# Patient Record
Sex: Male | Born: 1990 | Race: White | Hispanic: No | Marital: Single | Smoking: Current every day smoker
Health system: Southern US, Community
[De-identification: ages and names within clinical notes are randomized; demographics above are authoritative.]

## PROBLEM LIST (undated history)

## (undated) HISTORY — PX: ORTHOPEDIC SURGERY: SHX850

## (undated) HISTORY — PX: ESOPHAGUS SURGERY: SHX626

---
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
## 2014-05-04 ENCOUNTER — Encounter (HOSPITAL_COMMUNITY): Payer: Self-pay | Admitting: Emergency Medicine

## 2014-05-04 ENCOUNTER — Emergency Department (HOSPITAL_COMMUNITY)
Admission: EM | Admit: 2014-05-04 | Discharge: 2014-05-04 | Disposition: A | Discharge status: Home / Self Care | Payer: Self-pay | Attending: Emergency Medicine | Admitting: Emergency Medicine

## 2014-05-04 DIAGNOSIS — F17200 Nicotine dependence, unspecified, uncomplicated: Secondary | ICD-10-CM | POA: Insufficient documentation

## 2014-05-04 DIAGNOSIS — M62830 Muscle spasm of back: Secondary | ICD-10-CM

## 2014-05-04 DIAGNOSIS — S46812A Strain of other muscles, fascia and tendons at shoulder and upper arm level, left arm, initial encounter: Secondary | ICD-10-CM

## 2014-05-04 DIAGNOSIS — Y9389 Activity, other specified: Secondary | ICD-10-CM | POA: Insufficient documentation

## 2014-05-04 DIAGNOSIS — S43499A Other sprain of unspecified shoulder joint, initial encounter: Secondary | ICD-10-CM | POA: Insufficient documentation

## 2014-05-04 DIAGNOSIS — Y92410 Unspecified street and highway as the place of occurrence of the external cause: Secondary | ICD-10-CM | POA: Insufficient documentation

## 2014-05-04 DIAGNOSIS — S0990XA Unspecified injury of head, initial encounter: Secondary | ICD-10-CM | POA: Insufficient documentation

## 2014-05-04 DIAGNOSIS — S46819A Strain of other muscles, fascia and tendons at shoulder and upper arm level, unspecified arm, initial encounter: Secondary | ICD-10-CM

## 2014-05-04 DIAGNOSIS — IMO0002 Reserved for concepts with insufficient information to code with codable children: Secondary | ICD-10-CM | POA: Insufficient documentation

## 2014-05-04 MED ORDER — CYCLOBENZAPRINE HCL 10 MG PO TABS: 10.0000 mg | ORAL_TABLET | Freq: Three times a day (TID) | ORAL | Status: AC | PRN

## 2014-05-04 MED ORDER — IBUPROFEN 800 MG PO TABS
800.0000 mg | ORAL_TABLET | Freq: Once | ORAL | Status: AC
  Administered 2014-05-04: 800 mg via ORAL
  Filled 2014-05-04: qty 1

## 2014-05-04 MED ORDER — IBUPROFEN 800 MG PO TABS: 800.0000 mg | ORAL_TABLET | Freq: Three times a day (TID) | ORAL | Status: DC

## 2014-05-04 MED ORDER — CYCLOBENZAPRINE HCL 10 MG PO TABS
5.0000 mg | ORAL_TABLET | Freq: Once | ORAL | Status: AC
  Administered 2014-05-04: 5 mg via ORAL
  Filled 2014-05-04: qty 1

## 2014-05-04 NOTE — Discharge Instructions (Signed)
Please use rest, ice, compression and no lesions reduced pain and swelling. Follow up with a primary care provider or orthopedic specialist for continued evaluation and treatment.    Back Exercises Back exercises help treat and prevent back injuries. The goal is to increase your strength in your belly (abdominal) and back muscles. These exercises can also help with flexibility. Start these exercises when told by your doctor. HOME CARE Back exercises include: Pelvic Tilt.  Lie on your back with your knees bent. Tilt your pelvis until the lower part of your back is against the floor. Hold this position 5 to 10 sec. Repeat this exercise 5 to 10 times. Knee to Chest.  Pull 1 knee up against your chest and hold for 20 to 30 seconds. Repeat this with the other knee. This may be done with the other leg straight or bent, whichever feels better. Then, pull both knees up against your chest. Sit-Ups or Curl-Ups.  Bend your knees 90 degrees. Start with tilting your pelvis, and do a partial, slow sit-up. Only lift your upper half 30 to 45 degrees off the floor. Take at least 2 to 3 seonds for each sit-up. Do not do sit-ups with your knees out straight. If partial sit-ups are difficult, simply do the above but with only tightening your belly (abdominal) muscles and holding it as told. Hip-Lift.  Lie on your back with your knees flexed 90 degrees. Push down with your feet and shoulders as you raise your hips 2 inches off the floor. Hold for 10 seconds, repeat 5 to 10 times. Back Arches.  Lie on your stomach. Prop yourself up on bent elbows. Slowly press on your hands, causing an arch in your low back. Repeat 3 to 5 times. Shoulder-Lifts.  Lie face down with arms beside your body. Keep hips and belly pressed to floor as you slowly lift your head and shoulders off the floor. Do not overdo your exercises. Be careful in the beginning. Exercises may cause you some mild back discomfort. If the pain lasts for  more than 15 minutes, stop the exercises until you see your doctor. Improvement with exercise for back problems is slow.  Document Released: 09/20/2010 Document Revised: 11/10/2011 Document Reviewed: 06/19/2011 [facility] Patient Information 2015 [place], [place]. This information is not intended to replace advice given to you by your health care provider. Make sure you discuss any questions you have with your health care provider.    Heat Therapy Heat therapy can help make painful, stiff muscles and joints feel better. Do not use heat on new injuries. Wait at least 48 hours after an injury to use heat. Do not use heat when you have aches or pains right after an activity. If you still have pain 3 hours after stopping the activity, then you may use heat. HOME CARE Wet heat pack  Soak a clean towel in warm water. Squeeze out the extra water.  Put the warm, wet towel in a plastic bag.  Place a thin, dry towel between your skin and the bag.  Put the heat pack on the area for 5 minutes, and check your skin. Your skin may be pink, but it should not be red.  Leave the heat pack on the area for 15 to 30 minutes.  Repeat this every 2 to 4 hours while awake. Do not use heat while you are sleeping. Warm water bath  Fill a tub with warm water.  Place the affected body part in the tub.  Soak  the area for 20 to 40 minutes.  Repeat as needed. Hot water bottle  Fill the water bottle half full with hot water.  Press out the extra air. Close the cap tightly.  Place a dry towel between your skin and the bottle.  Put the bottle on the area for 5 minutes, and check your skin. Your skin may be pink, but it should not be red.  Leave the bottle on the area for 15 to 30 minutes.  Repeat this every 2 to 4 hours while awake. Electric heating pad  Place a dry towel between your skin and the heating pad.  Set the heating pad on low heat.  Put the heating pad on the area for 10 minutes, and check  your skin. Your skin may be pink, but it should not be red.  Leave the heating pad on the area for 20 to 40 minutes.  Repeat this every 2 to 4 hours while awake.  Do not lie on the heating pad.  Do not fall asleep while using the heating pad.  Do not use the heating pad near water. GET HELP RIGHT AWAY IF:  You get blisters or red skin.  Your skin is puffy (swollen), or you lose feeling (numbness) in the affected area.  You have any new problems.  Your problems are getting worse.  You have any questions or concerns. If you have any problems, stop using heat therapy until you see your doctor. MAKE SURE YOU:  Understand these instructions.  Will watch your condition.  Will get help right away if you are not doing well or get worse. Document Released: 11/10/2011 Document Reviewed: 10/11/2013 [facility] Patient Information 2015 [place], [place]. This information is not intended to replace advice given to you by your health care provider. Make sure you discuss any questions you have with your health care provider.    Muscle Cramps and Spasms Muscle cramps and spasms are when muscles tighten by themselves. They usually get better within minutes. Muscle cramps are painful. They are usually stronger and last longer than muscle spasms. Muscle spasms may or may not be painful. They can last a few seconds or much longer. HOME CARE  Drink enough fluid to keep your pee (urine) clear or pale yellow.  Massage, stretch, and relax the muscle.  Use a warm towel, heating pad, or warm shower water on tight muscles.  Place ice on the muscle if it is tender or in pain.  Put ice in a plastic bag.  Place a towel between your skin and the bag.  Leave the ice on for 15-20 minutes, 03-04 times a day.  Only take medicine as told by your doctor. GET HELP RIGHT AWAY IF:  Your cramps or spasms get worse, happen more often, or do not get better with time. MAKE SURE YOU:  Understand these  instructions.  Will watch your condition.  Will get help right away if you are not doing well or get worse. Document Released: 07/31/2008 Document Revised: 12/13/2012 Document Reviewed: 08/04/2012 [facility] Patient Information 2015 [employer], [place]. This information is not intended to replace advice given to you by your health care provider. Make sure you discuss any questions you have with your health care provider.    Muscle Strain A muscle strain (pulled muscle) happens when a muscle is stretched beyond normal length. It happens when a sudden, violent force stretches your muscle too far. Usually, a few of the fibers in your muscle are torn. Muscle strain is  common in athletes. Recovery usually takes 1-2 weeks. Complete healing takes 5-6 weeks.  HOME CARE   Follow the PRICE method of treatment to help your injury get better. Do this the first 2-3 days after the injury:  Protect. Protect the muscle to keep it from getting injured again.  Rest. Limit your activity and rest the injured body part.  Ice. Put ice in a plastic bag. Place a towel between your skin and the bag. Then, apply the ice and leave it on from 15-20 minutes each hour. After the third day, switch to moist heat packs.  Compression. Use a splint or elastic bandage on the injured area for comfort. Do not put it on too tightly.  Elevate. Keep the injured body part above the level of your heart.  Only take medicine as told by your doctor.  Warm up before doing exercise to prevent future muscle strains. GET HELP IF:   You have more pain or puffiness (swelling) in the injured area.  You feel numbness, tingling, or notice a loss of strength in the injured area. MAKE SURE YOU:   Understand these instructions.  Will watch your condition.  Will get help right away if you are not doing well or get worse. Document Released: 05/27/2008 Document Revised: 06/08/2013 Document Reviewed: 03/17/2013 [facility] Patient Information  2015 [place], [place]. This information is not intended to replace advice given to you by your health care provider. Make sure you discuss any questions you have with your health care provider.
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
## 2014-05-07 NOTE — ED Provider Notes (Signed)
Medical screening examination/treatment/procedure(s) were performed by non-physician practitioner and as supervising physician I was immediately available for consultation/collaboration.  Toy Baker, MD 05/07/14 [phone]

## 2014-08-20 ENCOUNTER — Encounter (HOSPITAL_COMMUNITY): Payer: Self-pay | Admitting: *Deleted

## 2014-08-20 ENCOUNTER — Emergency Department (HOSPITAL_COMMUNITY)
Admission: EM | Admit: 2014-08-20 | Discharge: 2014-08-20 | Disposition: A | Discharge status: Home / Self Care | Payer: No Typology Code available for payment source | Attending: Emergency Medicine | Admitting: Emergency Medicine

## 2014-08-20 DIAGNOSIS — J020 Streptococcal pharyngitis: Secondary | ICD-10-CM | POA: Insufficient documentation

## 2014-08-20 DIAGNOSIS — R110 Nausea: Secondary | ICD-10-CM | POA: Insufficient documentation

## 2014-08-20 DIAGNOSIS — Z720 Tobacco use: Secondary | ICD-10-CM | POA: Insufficient documentation

## 2014-08-20 LAB — RAPID STREP SCREEN (MED CTR MEBANE ONLY): Streptococcus, Group A Screen (Direct): POSITIVE — AB

## 2014-08-20 MED ORDER — PENICILLIN G BENZATHINE 1200000 UNIT/2ML IM SUSP
1.2000 10*6.[IU] | Freq: Once | INTRAMUSCULAR | Status: AC
  Administered 2014-08-20: 1.2 10*6.[IU] via INTRAMUSCULAR
  Filled 2014-08-20: qty 2

## 2014-08-20 MED ORDER — HYDROCODONE-ACETAMINOPHEN 7.5-325 MG/15ML PO SOLN: 15.0000 mL | Freq: Four times a day (QID) | ORAL | Status: AC | PRN

## 2014-08-20 MED ORDER — HYDROCODONE-ACETAMINOPHEN 7.5-325 MG/15ML PO SOLN
10.0000 mL | Freq: Once | ORAL | Status: AC
  Administered 2014-08-20: 10 mL via ORAL
  Filled 2014-08-20: qty 15

## 2014-08-20 NOTE — ED Provider Notes (Signed)
CSN: [id number]     Arrival date & time 08/20/14  0044 History   First MD Initiated Contact with Patient 08/20/14 0048     Chief Complaint  Patient presents with  . Sore Throat     (Consider location/radiation/quality/duration/timing/severity/associated sxs/prior Treatment) Patient is a 23 y.o. male presenting with pharyngitis. The history is provided by the patient. No language interpreter was used.  Sore Throat Episode onset: 2 weeks ago. The problem occurs constantly. The problem has been gradually worsening (worse over the past few days). Associated symptoms include chills, a fever (subjective), nausea and a sore throat. Pertinent negatives include no congestion, coughing, rash or vomiting. Associated symptoms comments: Negative for drooling or SOB. The symptoms are aggravated by swallowing and smoking. He has tried nothing for the symptoms. Improvement on treatment: No treatments tried PTA.    History reviewed. No pertinent past medical history. Past Surgical History  Procedure Laterality Date  . Esophagus surgery    . Orthopedic surgery     History reviewed. No pertinent family history. History  Substance Use Topics  . Smoking status: Current Every Day Smoker  . Smokeless tobacco: Not on file  . Alcohol Use: Yes    Review of Systems  Constitutional: Positive for fever (subjective) and chills.  HENT: Positive for sore throat. Negative for congestion, drooling and trouble swallowing.   Respiratory: Negative for cough.   Gastrointestinal: Positive for nausea. Negative for vomiting.  Skin: Negative for rash.  Hematological: Positive for adenopathy.  All other systems reviewed and are negative.   Allergies  Review of patient's allergies indicates no known allergies.  Home Medications   Prior to Admission medications   Medication Sig Start Date End Date Taking? Authorizing Provider  cyclobenzaprine (FLEXERIL) 10 MG tablet Take 1 tablet (10 mg total) by mouth 3 (three)  times daily as needed for muscle spasms. Patient not taking: Reported on 08/20/2014 05/04/14   Phill MutterPeter S Dammen, PA-C  HYDROcodone-acetaminophen (HYCET) 7.5-325 mg/15 ml solution Take 15 mLs by mouth every 6 (six) hours as needed for moderate pain or severe pain. 08/20/14   Antony MaduraKelly Mansi Tokar, PA-C  ibuprofen (ADVIL,MOTRIN) 800 MG tablet Take 1 tablet (800 mg total) by mouth 3 (three) times daily. Patient not taking: Reported on 08/20/2014 05/04/14   Phill MutterPeter S Dammen, PA-C   BP 125/58 mmHg  Pulse 87  Temp(Src) 98.4 F (36.9 C) (Oral)  Resp 20  SpO2 100%   Physical Exam  Constitutional: He is oriented to person, place, and time. He appears well-developed and well-nourished. No distress.  HENT:  Head: Normocephalic and atraumatic.  Mouth/Throat: Uvula is midline and mucous membranes are normal. No trismus in the jaw. Oropharyngeal exudate and posterior oropharyngeal erythema present.  B/l tonsils erythematous and enlarged with exudates. Uvula midline. Patient tolerating secretions without difficulty.  Eyes: Conjunctivae and EOM are normal. No scleral icterus.  Neck: Normal range of motion.  No nuchal rigidity or meningismus  Cardiovascular: Normal rate, regular rhythm and intact distal pulses.   Pulmonary/Chest: Effort normal. No respiratory distress.  Respirations even and unlabored.  Musculoskeletal: Normal range of motion.  Lymphadenopathy:    He has cervical adenopathy (tender anterior cervical lymphadenopathy).  Neurological: He is alert and oriented to person, place, and time. He exhibits normal muscle tone. Coordination normal.  GCS 15. Patient speaking in full sentences.  Skin: Skin is warm and dry. No rash noted. He is not diaphoretic. No erythema. No pallor.  Psychiatric: He has a normal mood and affect. His behavior  is normal.  Nursing note and vitals reviewed.   ED Course  Procedures (including critical care time) Labs Review Labs Reviewed  RAPID STREP SCREEN - Abnormal; Notable for  the following:    Streptococcus, Group A Screen (Direct) POSITIVE (*)    All other components within normal limits    Imaging Review No results found.   EKG Interpretation None      MDM   Final diagnoses:  Strep throat    Pt febrile with tonsillar exudate, cervical lymphadenopathy, and dysphagia; diagnosis of strep. Treated in the ED with Hycet and PCN IM. Discussed importance of water rehydration. Presentation not concerning for PTA or infxn spread to soft tissue. No trismus or uvula deviation. Specific return precautions discussed. Pt able to drink water in ED without difficulty with intact airway. Recommended PCP follow up. Return precautions provided and patient agreeable to plan with no unaddressed concerns.   Filed Vitals:   08/20/14 0059 08/20/14 0143  BP: 128/68 125/58  Pulse: 96 87  Temp: 98.5 F (36.9 C) 98.4 F (36.9 C)  TempSrc: Oral Oral  Resp: 18 20  SpO2: 98% 100%        Antony MaduraKelly Hershal Eriksson, PA-C 08/20/14 0149  Derwood KaplanAnkit Nanavati, MD 08/20/14 [phone]

## 2014-08-20 NOTE — Discharge Instructions (Signed)
You may take Hycet as prescribed for sore throat. You have been treated in the ED with a penicillin shot for your strep throat. Follow-up with her primary care doctor to ensure resolution of symptoms. Be sure to drink plenty of fluids to prevent dehydration. Return to the emergency department if you develop any of the symptoms listed below such as difficulty breathing, drooling, or inability to swallow.  Strep Throat Strep throat is an infection of the throat caused by a bacteria named Streptococcus pyogenes. Your health care provider may call the infection streptococcal "tonsillitis" or "pharyngitis" depending on whether there are signs of inflammation in the tonsils or back of the throat. Strep throat is most common in children aged 5-15 years during the cold months of the year, but it can occur in people of any age during any season. This infection is spread from person to person (contagious) through coughing, sneezing, or other close contact. SIGNS AND SYMPTOMS   Fever or chills.  Painful, swollen, red tonsils or throat.  Pain or difficulty when swallowing.  White or yellow spots on the tonsils or throat.  Swollen, tender lymph nodes or "glands" of the neck or under the jaw.  Red rash all over the body (rare). DIAGNOSIS  Many different infections can cause the same symptoms. A test must be done to confirm the diagnosis so the right treatment can be given. A "rapid strep test" can help your health care provider make the diagnosis in a few minutes. If this test is not available, a light swab of the infected area can be used for a throat culture test. If a throat culture test is done, results are usually available in a day or two. TREATMENT  Strep throat is treated with antibiotic medicine. HOME CARE INSTRUCTIONS   Gargle with 1 tsp of salt in 1 cup of warm water, 3-4 times per day or as needed for comfort.  Family members who also have a sore throat or fever should be tested for strep  throat and treated with antibiotics if they have the strep infection.  Make sure everyone in your household washes their hands well.  Do not share food, drinking cups, or personal items that could cause the infection to spread to others.  You may need to eat a soft food diet until your sore throat gets better.  Drink enough water and fluids to keep your urine clear or pale yellow. This will help prevent dehydration.  Get plenty of rest.  Stay home from school, day care, or work until you have been on antibiotics for 24 hours.  Take medicines only as directed by your health care provider.  Take your antibiotic medicine as directed by your health care provider. Finish it even if you start to feel better. SEEK MEDICAL CARE IF:   The glands in your neck continue to enlarge.  You develop a rash, cough, or earache.  You cough up green, yellow-brown, or bloody sputum.  You have pain or discomfort not controlled by medicines.  Your problems seem to be getting worse rather than better.  You have a fever. SEEK IMMEDIATE MEDICAL CARE IF:   You develop any new symptoms such as vomiting, severe headache, stiff or painful neck, chest pain, shortness of breath, or trouble swallowing.  You develop severe throat pain, drooling, or changes in your voice.  You develop swelling of the neck, or the skin on the neck becomes red and tender.  You develop signs of dehydration, such as fatigue, dry  mouth, and decreased urination.  You become increasingly sleepy, or you cannot wake up completely. MAKE SURE YOU:  Understand these instructions.  Will watch your condition.  Will get help right away if you are not doing well or get worse. Document Released: 08/15/2000 Document Revised: 01/02/2014 Document Reviewed: 10/17/2010 [facility] Patient Information 2015 [place]. This information is not intended to replace advice given to you by your health care provider. Make sure you discuss any  questions you have with your health care provider.

## 2014-08-20 NOTE — ED Notes (Signed)
Pt reports sorethroat x2 weeks, progressively worse - denies fever however admits to episodes of sweating last night.

## 2014-12-15 ENCOUNTER — Emergency Department (HOSPITAL_COMMUNITY): Payer: Self-pay

## 2014-12-15 ENCOUNTER — Encounter (HOSPITAL_COMMUNITY): Payer: Self-pay | Admitting: *Deleted

## 2014-12-15 ENCOUNTER — Emergency Department (HOSPITAL_COMMUNITY)
Admission: EM | Admit: 2014-12-15 | Discharge: 2014-12-15 | Disposition: A | Discharge status: Home / Self Care | Payer: Self-pay | Attending: Emergency Medicine | Admitting: Emergency Medicine

## 2014-12-15 DIAGNOSIS — S6991XA Unspecified injury of right wrist, hand and finger(s), initial encounter: Secondary | ICD-10-CM | POA: Insufficient documentation

## 2014-12-15 DIAGNOSIS — W228XXA Striking against or struck by other objects, initial encounter: Secondary | ICD-10-CM | POA: Insufficient documentation

## 2014-12-15 DIAGNOSIS — Z720 Tobacco use: Secondary | ICD-10-CM | POA: Insufficient documentation

## 2014-12-15 DIAGNOSIS — Y9289 Other specified places as the place of occurrence of the external cause: Secondary | ICD-10-CM | POA: Insufficient documentation

## 2014-12-15 DIAGNOSIS — Z791 Long term (current) use of non-steroidal anti-inflammatories (NSAID): Secondary | ICD-10-CM | POA: Insufficient documentation

## 2014-12-15 DIAGNOSIS — Y998 Other external cause status: Secondary | ICD-10-CM | POA: Insufficient documentation

## 2014-12-15 DIAGNOSIS — Y9389 Activity, other specified: Secondary | ICD-10-CM | POA: Insufficient documentation

## 2014-12-15 MED ORDER — IBUPROFEN 800 MG PO TABS: 800.0000 mg | ORAL_TABLET | Freq: Three times a day (TID) | ORAL | Status: DC
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
## 2015-10-14 ENCOUNTER — Emergency Department (HOSPITAL_COMMUNITY): Payer: BLUE CROSS/BLUE SHIELD

## 2015-10-14 ENCOUNTER — Emergency Department (HOSPITAL_COMMUNITY)
Admission: EM | Admit: 2015-10-14 | Discharge: 2015-10-14 | Disposition: A | Discharge status: Home / Self Care | Payer: BLUE CROSS/BLUE SHIELD | Attending: Emergency Medicine | Admitting: Emergency Medicine

## 2015-10-14 ENCOUNTER — Encounter (HOSPITAL_COMMUNITY): Payer: Self-pay | Admitting: Emergency Medicine

## 2015-10-14 DIAGNOSIS — F17200 Nicotine dependence, unspecified, uncomplicated: Secondary | ICD-10-CM | POA: Insufficient documentation

## 2015-10-14 DIAGNOSIS — Z791 Long term (current) use of non-steroidal anti-inflammatories (NSAID): Secondary | ICD-10-CM | POA: Insufficient documentation

## 2015-10-14 DIAGNOSIS — M542 Cervicalgia: Secondary | ICD-10-CM | POA: Diagnosis present

## 2015-10-14 DIAGNOSIS — M62838 Other muscle spasm: Secondary | ICD-10-CM | POA: Diagnosis not present

## 2015-10-14 DIAGNOSIS — R0602 Shortness of breath: Secondary | ICD-10-CM | POA: Diagnosis not present

## 2015-10-14 MED ORDER — IBUPROFEN 800 MG PO TABS: 800.0000 mg | ORAL_TABLET | Freq: Three times a day (TID) | ORAL | Status: AC

## 2015-10-14 MED ORDER — IBUPROFEN 400 MG PO TABS
800.0000 mg | ORAL_TABLET | Freq: Once | ORAL | Status: AC
  Administered 2015-10-14: 800 mg via ORAL
  Filled 2015-10-14: qty 2

## 2015-10-14 MED ORDER — METHOCARBAMOL 500 MG PO TABS: 500.0000 mg | ORAL_TABLET | Freq: Two times a day (BID) | ORAL | Status: AC

## 2015-10-14 MED ORDER — METHOCARBAMOL 500 MG PO TABS
1000.0000 mg | ORAL_TABLET | Freq: Once | ORAL | Status: AC
  Administered 2015-10-14: 1000 mg via ORAL
  Filled 2015-10-14: qty 2

## 2015-10-14 NOTE — ED Notes (Signed)
Pt states he woke up and his L side of his neck started hurting, hurts to move his arms up and it pulls on his neck. Pain is 10/10. Denies injury

## 2015-10-14 NOTE — ED Notes (Signed)
Requesting more pain medication.  PA at the bedside offered tylenol.  Refused.  Reports being unhappy with care.  Offered to let them speak with charge nurse.  Refused.  States we just want to leave.

## 2015-10-14 NOTE — ED Provider Notes (Signed)
CSN: [id number]     Arrival date & time 10/14/15  1953 History   By signing my name below, I, Evon Slack, attest that this documentation has been prepared under the direction and in the presence of Melton Krebs, PA-C. Electronically Signed: Evon Slack, ED Scribe. 10/14/2015. 8:30 PM.     Chief Complaint  Patient presents with  . Neck Pain   Patient is a 25 y.o. male presenting with neck pain. The history is provided by the patient. No language interpreter was used.  Neck Pain Associated symptoms: no fever    HPI Comments: Chad Yang is a 25 y.o. male who presents to the Emergency Department complaining of left sided neck pain onset today this morning when he woke up. He states that the pain is worse when moving his left arm and moving his neck. He states that when moving his arm it feels as if his neck is "pulling." He reports intermittent SOB due to the pain. He doesn't report any medications PTA. Pt denies trauma or injury to the neck. Denies fever or chills.   History reviewed. No pertinent past medical history. Past Surgical History  Procedure Laterality Date  . Esophagus surgery    . Orthopedic surgery     No family history on file. Social History  Substance Use Topics  . Smoking status: Current Every Day Smoker  . Smokeless tobacco: None  . Alcohol Use: Yes    Review of Systems  Constitutional: Negative for fever and chills.  Musculoskeletal: Positive for myalgias and neck pain.   10 Systems reviewed and all are negative for acute change except as noted in the HPI.   Allergies  Review of patient's allergies indicates no known allergies.  Home Medications   Prior to Admission medications   Medication Sig Start Date End Date Taking? Authorizing Provider  cyclobenzaprine (FLEXERIL) 10 MG tablet Take 1 tablet (10 mg total) by mouth 3 (three) times daily as needed for muscle spasms. Patient not taking: Reported on 08/20/2014 05/04/14   Ivonne Andrew,  PA-C  HYDROcodone-acetaminophen (HYCET) 7.5-325 mg/15 ml solution Take 15 mLs by mouth every 6 (six) hours as needed for moderate pain or severe pain. 08/20/14   Antony Madura, PA-C  ibuprofen (ADVIL,MOTRIN) 800 MG tablet Take 1 tablet (800 mg total) by mouth 3 (three) times daily. 12/15/14   Heather Laisure, PA-C   BP 143/84 mmHg  Pulse 87  Temp(Src) 98.2 F (36.8 C) (Oral)  Resp 18  SpO2 100%   Physical Exam  Constitutional: He is oriented to person, place, and time. He appears well-developed and well-nourished. No distress.  HENT:  Head: Normocephalic and atraumatic.  Eyes: Conjunctivae and EOM are normal.  Neck: Neck supple. No tracheal deviation present.  No meningeal signs  Cardiovascular: Normal rate.   Pulmonary/Chest: Effort normal. No respiratory distress.    Musculoskeletal: Normal range of motion. He exhibits tenderness. He exhibits no edema.  Left trapezius tenderness. Normal range of motion at left shoulder and c-spine. No midline tenderness.   Neurological: He is alert and oriented to person, place, and time.  Skin: Skin is warm and dry.  Psychiatric: He has a normal mood and affect. His behavior is normal.  Nursing note and vitals reviewed.   ED Course  Procedures  DIAGNOSTIC STUDIES: Oxygen Saturation is 100% on RA, normal by my interpretation.    COORDINATION OF CARE: 8:29 PM-Discussed treatment plan with pt at bedside and pt agreed to plan.   Labs Review Labs Reviewed -  No data to display  Imaging Review Dg Shoulder Left  10/14/2015  CLINICAL DATA:  Left posterior shoulder pain radiating to the neck, worse today. EXAM: LEFT SHOULDER - 2+ VIEW COMPARISON:  None. FINDINGS: There is no evidence of fracture or dislocation. There is no evidence of arthropathy or other focal bone abnormality. Soft tissues are unremarkable. IMPRESSION: Negative. Electronically Signed   By: Burman Nieves M.D.   On: 10/14/2015 21:49   MDM   Final diagnoses:  Trapezius muscle  spasm   Discussed plan with patient. Explained to him I feel this is more trapezius muscle pain and that x-ray is not indicated at this time. His girlfriend, present at bedside, is concerned because a meningitis patient has been in their apartment complex, and she is concerned about his shortness of breath. PE, ACS is less likely. Patient is afebrile and without meningeal signs. I explained I do not feel further workup is indicated at this time for shortness of breath or meningitis, as I feel this is musculoskeletal in nature. Patient stated, "I'm not trying to tell you you're wrong but that is not what this is. There's something wrong with my shoulder." I touched his shoulder and said, "This is your shoulder and you're not having tenderness here but you are having tenderness over your trapezius muscle." As I touched his trapezius muscle, he jumped in pain. Patient stated "you're getting smart with me and condescending." I apologized that I was coming off this way to them, and asked if it would make the patient feel like he had better care if I x-rayed his shoulder. Will x-ray his shoulder, provide muscle relaxer and high-dose ibuprofen.  Xray negative. Discussed results with patient. He states his pain has not changed despite muscle relaxer and ibuprofen. They are requesting a shot, and RN asked about toradol shot, but he was recently given an NSAID. Offered Tylenol but patient refused.  He is extremely upset about his care tonight. He states I was rude and that I did nothing for him. I asked the RN to contact the charge RN to discuss the patient's care with patient.  Patient refused conversation with charge RN.  Patient ambulated from department without difficulty.   I personally performed the services described in this documentation, which was scribed in my presence. The recorded information has been reviewed and is accurate.  Melton Krebs, PA-C 10/17/15 1610  Gerhard Munch, MD 10/17/15  [phone]

## 2015-10-14 NOTE — Discharge Instructions (Signed)
Chad Yang,  Please follow-up with orthopedics. Return to the emergency department if you develop shortness of breath during exertion, chest pain, increased pain. Feel better soon!   Heat Therapy Heat therapy can help ease sore, stiff, injured, and tight muscles and joints. Heat relaxes your muscles, which may help ease your pain.  RISKS AND COMPLICATIONS If you have any of the following conditions, do not use heat therapy unless your health care provider has approved:  Poor circulation.  Healing wounds or scarred skin in the area being treated.  Diabetes, heart disease, or high blood pressure.  Not being able to feel (numbness) the area being treated.  Unusual swelling of the area being treated.  Active infections.  Blood clots.  Cancer.  Inability to communicate pain. This may include young children and people who have problems with their brain function (dementia).  Pregnancy. Heat therapy should only be used on old, pre-existing, or long-lasting (chronic) injuries. Do not use heat therapy on new injuries unless directed by your health care provider. HOW TO USE HEAT THERAPY There are several different kinds of heat therapy, including:  Moist heat pack.  Warm water bath.  Hot water bottle.  Electric heating pad.  Heated gel pack.  Heated wrap.  Electric heating pad. Use the heat therapy method suggested by your health care provider. Follow your health care provider's instructions on when and how to use heat therapy. GENERAL HEAT THERAPY RECOMMENDATIONS  Do not sleep while using heat therapy. Only use heat therapy while you are awake.  Your skin may turn pink while using heat therapy. Do not use heat therapy if your skin turns red.  Do not use heat therapy if you have new pain.  High heat or long exposure to heat can cause burns. Be careful when using heat therapy to avoid burning your skin.  Do not use heat therapy on areas of your skin that are already  irritated, such as with a rash or sunburn. SEEK MEDICAL CARE IF:  You have blisters, redness, swelling, or numbness.  You have new pain.  Your pain is worse. MAKE SURE YOU:  Understand these instructions.  Will watch your condition.  Will get help right away if you are not doing well or get worse.   This information is not intended to replace advice given to you by your health care provider. Make sure you discuss any questions you have with your health care provider.   Document Released: 11/10/2011 Document Revised: 09/08/2014 Document Reviewed: 10/11/2013 Elsevier Interactive Patient Education [employer]

## 2015-10-14 NOTE — ED Notes (Signed)
Discharge instructions explained to patient.  Girlfriend questioning the instruction to return for SOB.  States he's short of breath now.  Spoke with patient regarding SOB.  Asked if he was short of breath or afraid to take a deep breath because of the pain.  Reports that it hurts to take a deep breath.

## 2016-07-16 IMAGING — DX DG SHOULDER 2+V*L*
3 series · 3 of 3 positions shown · non-contrast
Comparison: None.

CLINICAL DATA: Left posterior shoulder pain radiating to the neck,
worse today.

EXAM:
LEFT SHOULDER - 2+ VIEW

[shoulder grashey]
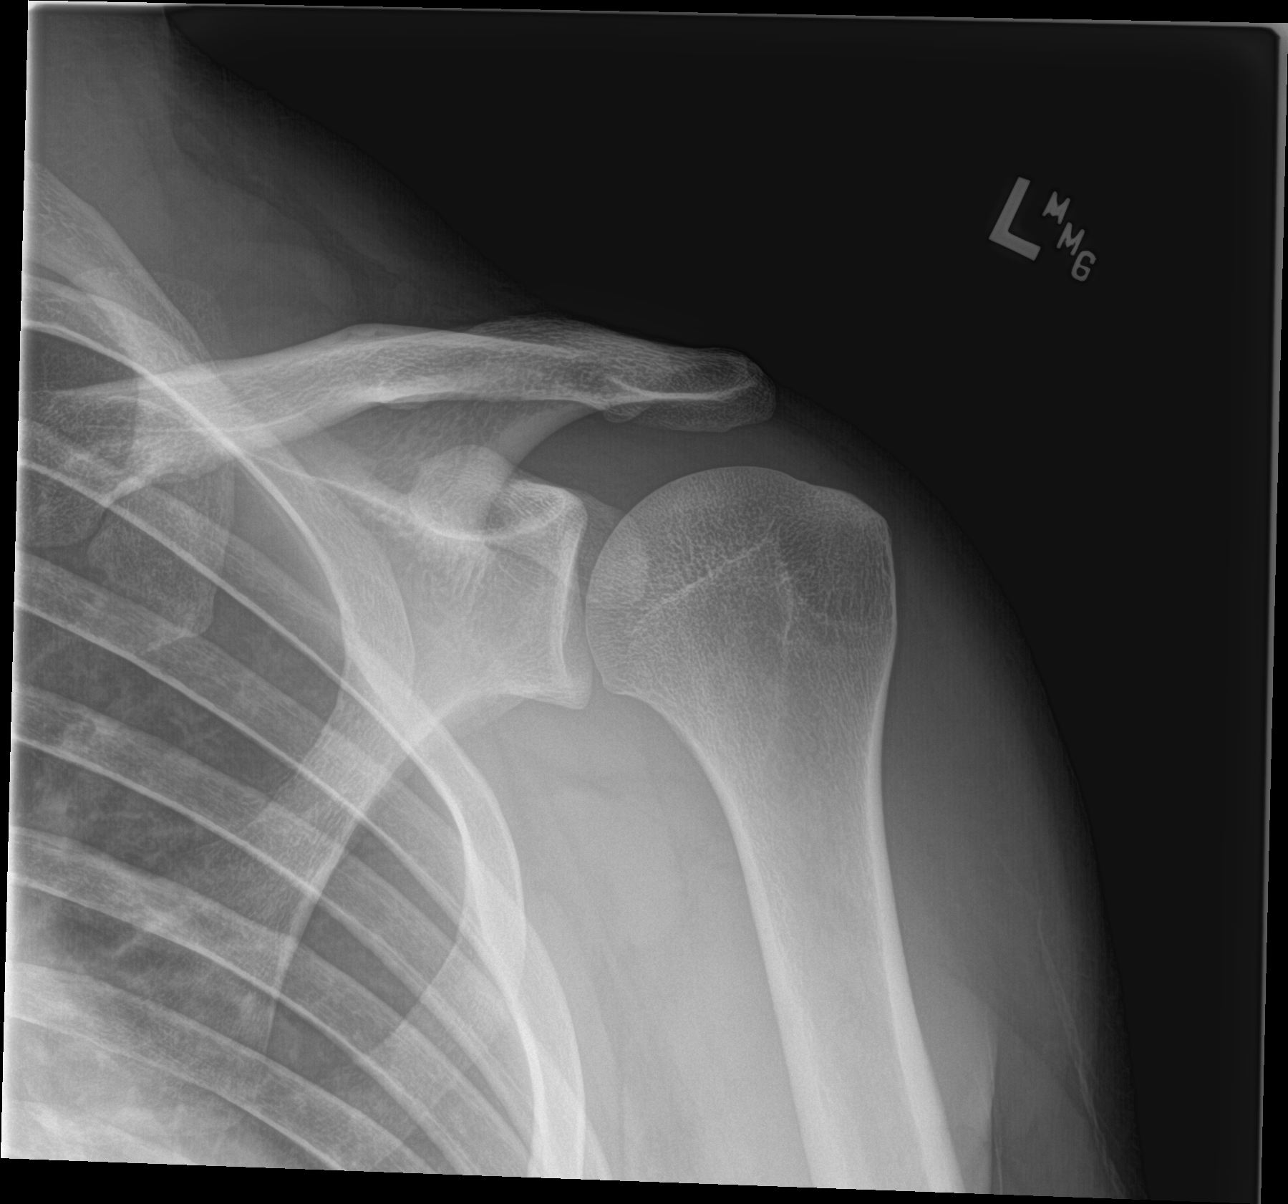

[shoulder y view]
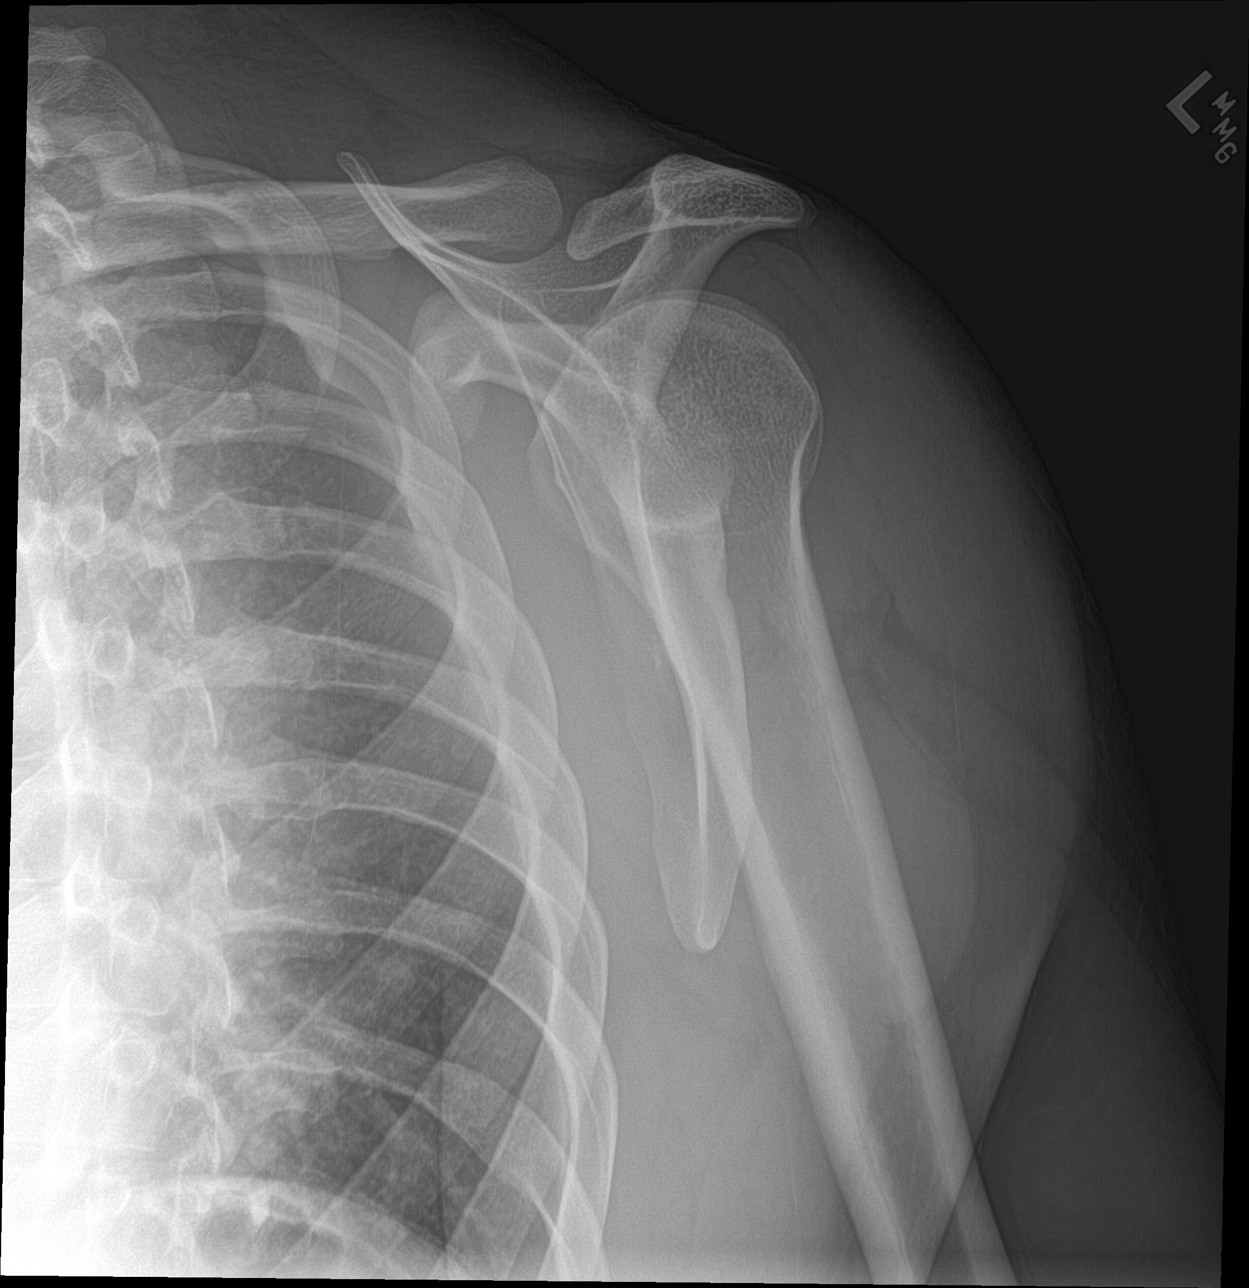

[shoulder axillary]
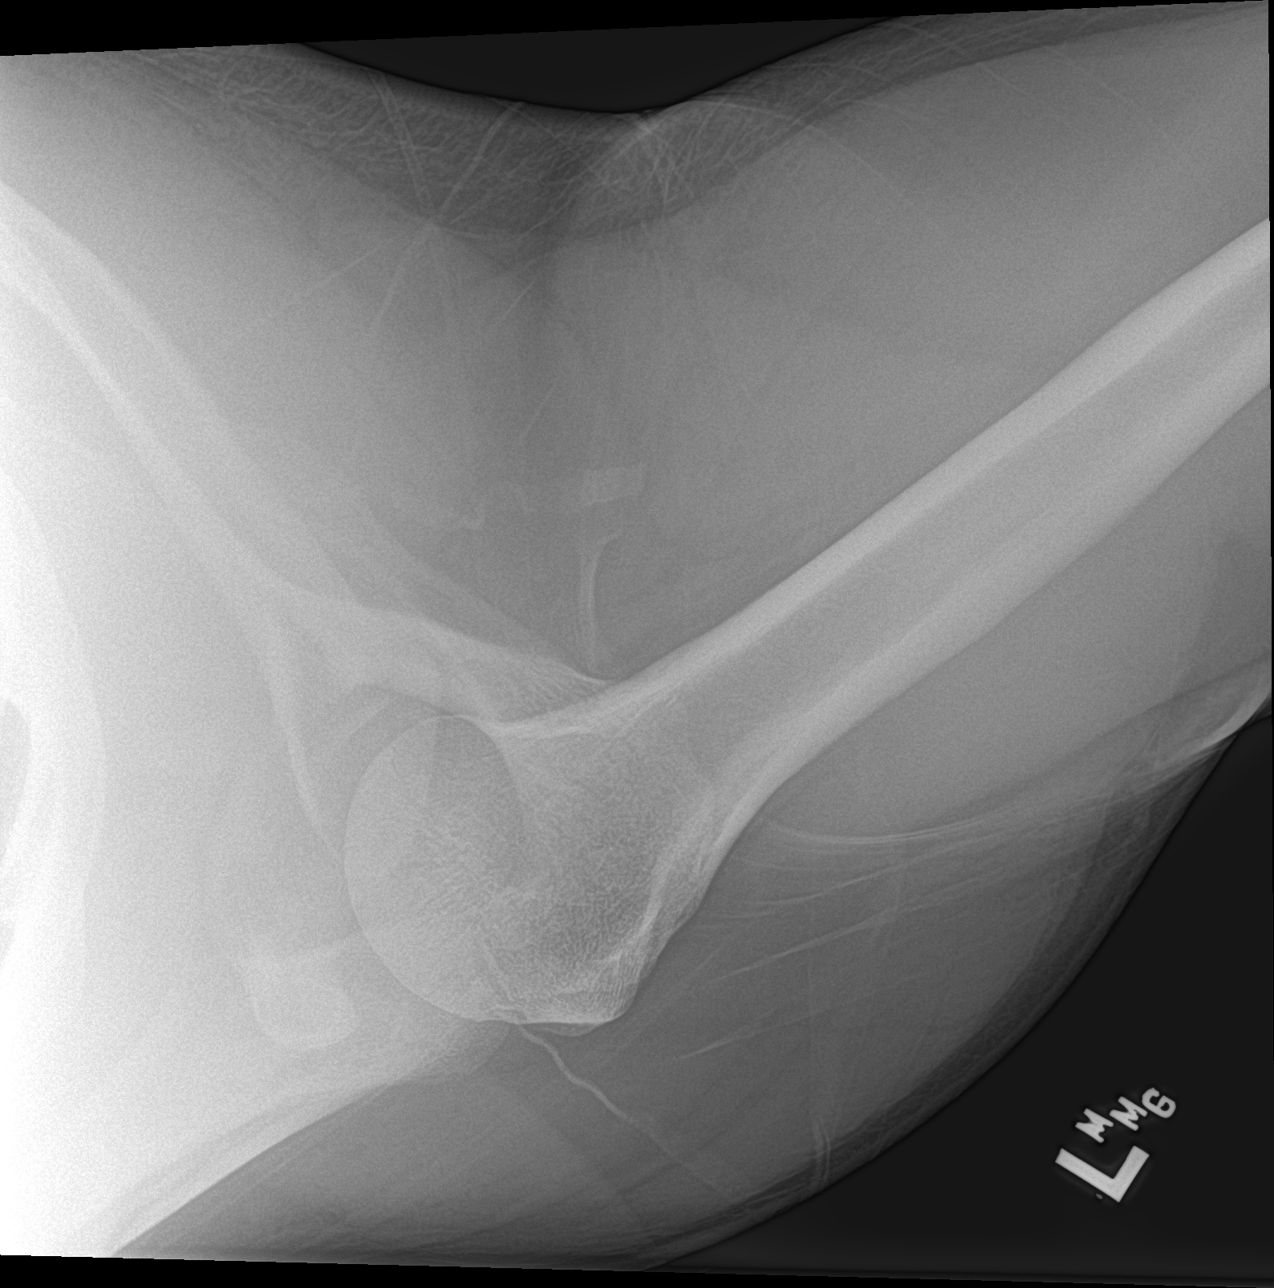

[3 of 3 positions shown; findings below may reference images not displayed]

FINDINGS: There is no evidence of fracture or dislocation. There is no
evidence of arthropathy or other focal bone abnormality. Soft
tissues are unremarkable.
IMPRESSION: Negative.
